# Patient Record
Sex: Male | Born: 2000 | Race: Black or African American | Hispanic: No | Marital: Single | Smoking: Never smoker
Health system: Southern US, Community
[De-identification: ages and names within clinical notes are randomized; demographics above are authoritative.]

---
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
## 2018-09-06 ENCOUNTER — Emergency Department (HOSPITAL_COMMUNITY)
Admission: EM | Admit: 2018-09-06 | Discharge: 2018-09-06 | Disposition: A | Discharge status: Home / Self Care | Payer: Managed Care, Other (non HMO) | Attending: Emergency Medicine | Admitting: Emergency Medicine

## 2018-09-06 ENCOUNTER — Encounter (HOSPITAL_COMMUNITY): Payer: Self-pay | Admitting: *Deleted

## 2018-09-06 DIAGNOSIS — F1910 Other psychoactive substance abuse, uncomplicated: Secondary | ICD-10-CM | POA: Diagnosis not present

## 2018-09-06 DIAGNOSIS — R4182 Altered mental status, unspecified: Secondary | ICD-10-CM | POA: Insufficient documentation

## 2018-09-06 DIAGNOSIS — F29 Unspecified psychosis not due to a substance or known physiological condition: Secondary | ICD-10-CM | POA: Diagnosis present

## 2018-09-06 DIAGNOSIS — F23 Brief psychotic disorder: Secondary | ICD-10-CM | POA: Insufficient documentation

## 2018-09-06 LAB — RAPID URINE DRUG SCREEN, HOSP PERFORMED
Amphetamines: NOT DETECTED
Barbiturates: NOT DETECTED
Benzodiazepines: NOT DETECTED
Cocaine: NOT DETECTED
Opiates: NOT DETECTED
Tetrahydrocannabinol: POSITIVE — AB

## 2018-09-06 LAB — COMPREHENSIVE METABOLIC PANEL
ALT: 17 U/L (ref 0–44)
AST: 26 U/L (ref 15–41)
Albumin: 4.7 g/dL (ref 3.5–5.0)
Alkaline Phosphatase: 45 U/L (ref 38–126)
Anion gap: 8 (ref 5–15)
BUN: 9 mg/dL (ref 6–20)
CO2: 27 mmol/L (ref 22–32)
CREATININE: 0.93 mg/dL (ref 0.61–1.24)
Calcium: 9.9 mg/dL (ref 8.9–10.3)
Chloride: 105 mmol/L (ref 98–111)
GFR calc non Af Amer: >60 mL/min (ref >60)
Glucose, Bld: 80 mg/dL (ref 70–99)
Potassium: 3.7 mmol/L (ref 3.5–5.1)
Sodium: 140 mmol/L (ref 135–145)
Total Bilirubin: 1.2 mg/dL (ref 0.3–1.2)
Total Protein: 8.1 g/dL (ref 6.5–8.1)

## 2018-09-06 LAB — CBC
HCT: 43.1 % (ref 39.0–52.0)
Hemoglobin: 14 g/dL (ref 13.0–17.0)
MCH: 29 pg (ref 26.0–34.0)
MCHC: 32.5 g/dL (ref 30.0–36.0)
MCV: 89.4 fL (ref 80.0–100.0)
Platelets: 208 10*3/uL (ref 150–400)
RBC: 4.82 MIL/uL (ref 4.22–5.81)
RDW: 11.6 % (ref 11.5–15.5)
WBC: 6.2 10*3/uL (ref 4.0–10.5)
nRBC: 0 % (ref 0.0–0.2)

## 2018-09-06 LAB — ETHANOL: Alcohol, Ethyl (B): <10 mg/dL (ref <10)

## 2018-09-06 NOTE — Discharge Instructions (Signed)
Return to ED for worsening symptoms, chest pain, shortness of breath, injuries or falls.

## 2018-09-06 NOTE — ED Notes (Signed)
Pt aware of need for urine sample, urinal at bedside 

## 2018-09-06 NOTE — ED Provider Notes (Signed)
[facility] Provider Note   CSN: [id number] Arrival date & time: 09/06/18  1124     History   Chief Complaint Chief Complaint  Patient presents with  . Drug Problem    HPI Ernest Ellison is a 18 y.o. male who presents to ED for altered mental status.  Majority of history is provided by father at the bedside.  Patient took a trip to [place] from 08/19/2018 to 09/05/2018.  Father is unsure if patient took drugs there.  He was hospitalized twice while being in [place], once in the psych unit.  Father states that since patient has returned from and also while he was in [place], he "is talking out of his head."  He states that patient will have been normal conversations" revert back to being like a child."  Father any history of these symptoms in the past.  Patient states that he is here because his father brought him here and because he had a CT scan of his head done in [place].  Patient unable to provide further history as he is unsure why he is here.  States that he has suicidal thoughts but "that does not mean him can act on it."  Denies any HI, AVH.  He is unwilling to tell me which drugs he used in [place].  HPI  History reviewed. No pertinent past medical history.  There are no active problems to display for this patient.     Home Medications    Prior to Admission medications   Not on File    Family History History reviewed. No pertinent family history.  Social History Social History   Tobacco Use  . Smoking status: Not on file  Substance Use Topics  . Alcohol use: Not on file  . Drug use: Not on file     Allergies   Patient has no known allergies.   Review of Systems Review of Systems  Constitutional: Negative for appetite change, chills and fever.  HENT: Negative for ear pain, rhinorrhea, sneezing and sore throat.   Eyes: Negative for photophobia and visual disturbance.  Respiratory: Negative for  cough, chest tightness, shortness of breath and wheezing.   Cardiovascular: Negative for chest pain and palpitations.  Gastrointestinal: Negative for abdominal pain, blood in stool, constipation, diarrhea, nausea and vomiting.  Genitourinary: Negative for dysuria, hematuria and urgency.  Musculoskeletal: Negative for myalgias.  Skin: Negative for rash.  Neurological: Negative for dizziness, weakness and light-headedness.  Psychiatric/Behavioral: Positive for behavioral problems. The patient is nervous/anxious.      Physical Exam Updated Vital Signs BP 121/76 (BP Location: Right Arm)   Pulse 75   Temp 98 F (36.7 C) (Oral)   Resp 18   SpO2 100%   Physical Exam Vitals signs and nursing note reviewed.  Constitutional:      General: He is not in acute distress.    Appearance: He is well-developed.  HENT:     Head: Normocephalic and atraumatic.     Nose: Nose normal.  Eyes:     General: No scleral icterus.       Right eye: No discharge.        Left eye: No discharge.     Conjunctiva/sclera: Conjunctivae normal.     Pupils: Pupils are equal, round, and reactive to light.  Neck:     Musculoskeletal: Normal range of motion and neck supple.  Cardiovascular:     Rate and Rhythm: Normal rate and regular rhythm.  Heart sounds: Normal heart sounds. No murmur. No friction rub. No gallop.   Pulmonary:     Effort: Pulmonary effort is normal. No respiratory distress.     Breath sounds: Normal breath sounds.  Abdominal:     General: Bowel sounds are normal. There is no distension.     Palpations: Abdomen is soft.     Tenderness: There is no abdominal tenderness. There is no guarding.  Musculoskeletal: Normal range of motion.  Skin:    General: Skin is warm and dry.     Findings: No rash.  Neurological:     General: No focal deficit present.     Mental Status: He is alert and oriented to person, place, and time.     Cranial Nerves: No cranial nerve deficit.     Sensory: No  sensory deficit.     Motor: No weakness or abnormal muscle tone.     Coordination: Coordination normal.     Comments: Alert and oriented x4.  Psychiatric:        Speech: Speech is tangential.        Behavior: Behavior is agitated.      ED Treatments / Results  Labs (all labs ordered are listed, but only abnormal results are displayed) Labs Reviewed  RAPID URINE DRUG SCREEN, HOSP PERFORMED - Abnormal; Notable for the following components:      Result Value   Tetrahydrocannabinol POSITIVE (*)    All other components within normal limits  COMPREHENSIVE METABOLIC PANEL  ETHANOL  CBC    EKG None  Radiology No results found.  Procedures Procedures (including critical care time)  Medications Ordered in ED Medications - No data to display   Initial Impression / Assessment and Plan / ED Course  I have reviewed the triage vital signs and the nursing notes.  Pertinent labs & imaging results that were available during my care of the patient were reviewed by me and considered in my medical decision making (see chart for details).     18 year old male presents to ED for help with drug use.  Father at bedside provides much of history.  States that patient went on a trip to [place] from 12/26 to 1/12 with unknown drug use there.  Since then father is concerned that he may have some type of psychosis associated with drug use.  Patient has never been on any antipsychotic, antidepressant or antianxiety medications.  Patient only admits to marijuana use.  He is unsure why he is here, he is speaking in an odd way. Reports transient SI but none currently with no plan.  Denies any HI, AVH.  His medical screening lab work is unremarkable, UDS positive only for THC.  I will consult TTS for final disposition as he is medically clear for evaluation.  3:05 PM TTS recommends discharge.  Will discharge with outpatient resources.  Patient is hemodynamically stable, in NAD, and able to ambulate  in the ED. Evaluation does not show pathology that would require ongoing emergent intervention or inpatient treatment. I explained the diagnosis to the patient. Pain has been managed and has no complaints prior to discharge. Patient is comfortable with above plan and is stable for discharge at this time. All questions were answered prior to disposition. Strict return precautions for returning to the ED were discussed. Encouraged follow up with PCP.    Portions of this note were generated with Scientist, clinical (histocompatibility and immunogenetics)Dragon dictation software. Dictation errors may occur despite best attempts at proofreading.  Final Clinical Impressions(s) / ED  Diagnoses   Final diagnoses:  Substance abuse [facility])    ED Discharge Orders    None       Dietrich Pates, PA-C 09/06/18 1505    Doug Sou, MD 09/06/18 1825

## 2018-09-06 NOTE — ED Triage Notes (Signed)
Pt here for medical clearance to help with drug use, no distress noted

## 2018-09-06 NOTE — BH Assessment (Addendum)
Tele Assessment Note   Patient Name: Ernest Ellison MRN: [id number] Referring Physician: PA Hina [facility] Location of Provider: Behavioral Health TTS Department  Ernest SchoonerDarius C Ellison is an 18 y.o. male.  The pt came in due to SA and past bizarre behavior.  The pt was in [place] from 08/19/2018-09/05/2018.  While in [place], the pt went to a hospital due to erratic behavior.  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  The pt won't state how much marijuana he uses.  He stated, "I get lit."  He stated he can do an 8th of an ounce of marijuana.  The pt denies SI, HI and current psychosis.  The pt appeared restless, as evidenced by asking several times to take off the blood pressure cuff, remove his wrist band and wanting to pace.  The pt was pleasant and cooperative.  The pt denies any mental health treatment other than family counseling when his parents went through a divorce.  The pt lives primarily with his father.  He will sometime live with his mother in [place] or with friends.  The pt works for [employer].  The pt's father denies any mental health issues in the family.  The pt denies self harm, legal issues, history of abuse and hallucinations. The pt stated he sleeps well and has a fair appetite. The pt denies other SA besides marijuana.  The pt's UDS is negative for other substances.    Pt is dressed in a hospital gown. He is alert and oriented x4. Pt speaks in a clear tone, at moderate volume and normal pace. Eye contact is good. Pt's mood is restless and silly. Thought process is coherent and relevant. There is no indication Pt is currently responding to internal stimuli or experiencing delusional thought content.?Pt was cooperative throughout assessment.     Diagnosis: F23 Brief psychotic disorder  Past Medical History: History reviewed. No pertinent past medical history.   Family  History: History reviewed. No pertinent family history.  Social History:  has no history on file for tobacco, alcohol, and drug.  Additional Social History:  Alcohol / Drug Use Pain Medications: See MAR Prescriptions: See MAR Over the Counter: See MAR History of alcohol / drug use?: Yes Longest period of sobriety (when/how long): NA Substance #1 Name of Substance 1: marijuana 1 - Age of First Use: 13 1 - Amount (size/oz): pt would not specify.  "I smoke to get lit".  When asked if he can do an 8th he said yes 1 - Frequency: daily 1 - Duration: on going 1 - Last Use / Amount: 09/05/2018  CIWA: CIWA-Ar BP: 124/73 Pulse Rate: 83 COWS: Clinical Opiate Withdrawal Scale (COWS) Resting Pulse Rate: Pulse Rate 80 or below Sweating: No report of chills or flushing Restlessness: Frequent shifting or extraneous movements of legs/arms Pupil Size: Pupils pinned or normal size for room light Bone or Joint Aches: Not present Runny Nose or Tearing: Not present GI Upset: No GI symptoms Tremor: No tremor Yawning: No yawning Anxiety or Irritability: Patient obviously irritable/anxious Gooseflesh Skin: Skin is smooth COWS Total Score: 5  Allergies: No Known Allergies  Home Medications: (Not in a hospital admission)   OB/GYN Status:  No LMP for male patient.  General Assessment Data [facility] TTS Assessment: In system Is this a Tele or Face-to-Face Assessment?: Face-to-Face Is this an Initial Assessment or a Re-assessment for  this encounter?: Initial Assessment Patient Accompanied by:: Parent Language Other than English: No Living Arrangements: Other (Comment)(home) What gender do you identify as?: Male Marital status: Single Maiden name: NA Pregnancy Status: No Living Arrangements: Parent Can pt return to current living arrangement?: Yes Admission Status: Voluntary Is patient capable of signing voluntary admission?: Yes Referral Source:  Self/Family/Friend Insurance type: Medical sales representative     Crisis Care Plan Living Arrangements: Parent Legal Guardian: Other:(Self) Name of Psychiatrist: none Name of Therapist: none  Education Status Is patient currently in school?: No Is the patient employed, unemployed or receiving disability?: Employed  Risk to self with the past 6 months Suicidal Ideation: No Has patient been a risk to self within the past 6 months prior to admission? : No Suicidal Intent: No Has patient had any suicidal intent within the past 6 months prior to admission? : No Is patient at risk for suicide?: No Suicidal Plan?: No Has patient had any suicidal plan within the past 6 months prior to admission? : No Access to Means: No What has been your use of drugs/alcohol within the last 12 months?: marijuana use Previous Attempts/Gestures: No How many times?: 0 Other Self Harm Risks: none Triggers for Past Attempts: None known Intentional Self Injurious Behavior: None Family Suicide History: No Recent stressful life event(s): Other (Comment)(no major stressors) Persecutory voices/beliefs?: No Depression: No Substance abuse history and/or treatment for substance abuse?: Yes Suicide prevention information given to non-admitted patients: Yes  Risk to Others within the past 6 months Homicidal Ideation: No Does patient have any lifetime risk of violence toward others beyond the six months prior to admission? : No Thoughts of Harm to Others: No Current Homicidal Intent: No Current Homicidal Plan: No Access to Homicidal Means: No Identified Victim: none History of harm to others?: No Assessment of Violence: None Noted Violent Behavior Description: none Does patient have access to weapons?: No Criminal Charges Pending?: No Does patient have a court date: No Is patient on probation?: No  Psychosis Hallucinations: None noted Delusions: None noted  Mental Status Report Appearance/Hygiene: Unremarkable Eye  Contact: Good Motor Activity: Freedom of movement, Unremarkable Speech: Logical/coherent Level of Consciousness: Alert, Restless Mood: Silly, Anxious Affect: Silly, Irritable Anxiety Level: None Thought Processes: Coherent, Relevant Judgement: Partial Orientation: Person, Place, Time, Situation Obsessive Compulsive Thoughts/Behaviors: None  Cognitive Functioning Concentration: Normal Memory: Recent Intact, Remote Intact Is patient IDD: No Insight: Fair Impulse Control: Fair Appetite: Fair Have you had any weight changes? : No Change Sleep: No Change Total Hours of Sleep: 8 Vegetative Symptoms: None  ADLScreening [facility] Assessment Services) Patient's cognitive ability adequate to safely complete daily activities?: Yes Patient able to express need for assistance with ADLs?: Yes Independently performs ADLs?: Yes (appropriate for developmental age)  Prior Inpatient Therapy Prior Inpatient Therapy: No  Prior Outpatient Therapy Prior Outpatient Therapy: No Does patient have an ACCT team?: No Does patient have Intensive In-House Services?  : No Does patient have [facility] services? : No Does patient have P4CC services?: No  ADL Screening (condition at time of admission) Patient's cognitive ability adequate to safely complete daily activities?: Yes Patient able to express need for assistance with ADLs?: Yes Independently performs ADLs?: Yes (appropriate for developmental age)       Abuse/Neglect Assessment (Assessment to be complete while patient is alone) Abuse/Neglect Assessment Can Be Completed: Yes Physical Abuse: Denies Verbal Abuse: Denies Sexual Abuse: Denies Exploitation of patient/patient's resources: Denies Self-Neglect: Denies Values / Beliefs Cultural Requests During Hospitalization: None Spiritual Requests During  Hospitalization: None Consults Spiritual Care Consult Needed: No Social Work Consult Needed: No            Disposition:   Disposition Initial Assessment Completed for this Encounter: Yes  NP Nanine MeansJamison Lord recommends the pt be discharged and follow up with OPT appointment.  PA Hina was made aware of the recommendation.  This service was provided via telemedicine using a 2-way, interactive audio and video technology.  Names of all persons participating in this telemedicine service and their role in this encounter. Name: Aida PufferDarius Carns Role: Pt  Name: Dyann Kiefarrell Caccamo Role: Pt's father  Name: Riley ChurchesKendall Deniqua Perry Role: TTS  Name:  Role:     Ottis StainGarvin, Jamal Haskin Jermaine 09/06/2018 3:57 PM

## 2020-11-07 ENCOUNTER — Encounter: Payer: Self-pay | Admitting: Emergency Medicine

## 2020-11-07 ENCOUNTER — Other Ambulatory Visit: Payer: Self-pay

## 2020-11-07 ENCOUNTER — Ambulatory Visit
Admission: EM | Admit: 2020-11-07 | Discharge: 2020-11-07 | Disposition: A | Discharge status: Home / Self Care | Payer: Managed Care, Other (non HMO) | Attending: Family Medicine | Admitting: Family Medicine

## 2020-11-07 ENCOUNTER — Telehealth: Payer: Self-pay | Admitting: Family Medicine

## 2020-11-07 DIAGNOSIS — L0201 Cutaneous abscess of face: Secondary | ICD-10-CM

## 2020-11-07 MED ORDER — DOXYCYCLINE HYCLATE 100 MG PO CAPS: 100.0000 mg | ORAL_CAPSULE | Freq: Two times a day (BID) | ORAL | 0 refills | Status: DC

## 2020-11-07 MED ORDER — HYDROCODONE-ACETAMINOPHEN 5-325 MG PO TABS: 1.0000 | ORAL_TABLET | Freq: Four times a day (QID) | ORAL | 0 refills | Status: DC | PRN

## 2020-11-07 NOTE — Discharge Instructions (Signed)

## 2020-11-07 NOTE — ED Triage Notes (Signed)
abscess on upper lip has been there a few days.

## 2020-11-07 NOTE — ED Provider Notes (Signed)
[facility] CARE CENTER   [id number] 11/07/20 Arrival Time: 1206  ASSESSMENT & PLAN:  1. Facial abscess    Feel he should seen surgical evaluation and I&D. Discussed need for prompt evaluation.  Begin: Meds ordered this encounter  Medications  . doxycycline (VIBRAMYCIN) 100 MG capsule    Sig: Take 1 capsule (100 mg total) by mouth 2 (two) times daily.    Dispense:  20 capsule    Refill:  0  . HYDROcodone-acetaminophen (NORCO/VICODIN) 5-325 MG tablet    Sig: Take 1 tablet by mouth every 6 (six) hours as needed for moderate pain or severe pain.    Dispense:  10 tablet    Refill:  0   Will call to schedule work-in as below; otherwise will proceed to ED for evaluation.   Follow-up Information    Schedule an appointment as soon as possible for a visit  with Newman Pies, MD.   Specialty: Otolaryngology Contact information: [address] [place] [address] [phone]        Schedule an appointment as soon as possible for a visit  with [facility].   Contact information: [address] [place] [address] [phone]        Schedule an appointment as soon as possible for a visit  with [facility].   Specialty: Otolaryngology Contact information: [address] [place] [address] [phone] Additional information: Satellite Office - live on EPIC       [facility] EMERGENCY DEPARTMENT.   Specialty: Emergency Medicine Why: If you are unable to see an ENT doctor as we discussed. Contact information: [address] [id number] mc [place] [address] [phone]               Reviewed expectations re: course of current medical issues. Questions answered. Outlined signs and symptoms indicating need for more acute intervention. Patient verbalized understanding. After Visit Summary given.   SUBJECTIVE:  Ernest Ellison is a 20 y.o.  male who presents with a possible infection of his mid upper lip. Onset gradual, approximately 5 days ago with slight drainage and bleeding. Symptoms have gradually worsened since beginning. Increasing pain. Fever: absent. OTC/home treatment: "squeezing it" without much relief.   OBJECTIVE:  Vitals:   11/07/20 1215  BP: 135/82  Pulse: (!) 101  Resp: 18  Temp: 98.5 F (36.9 C)  TempSrc: Oral  SpO2: 93%    Slight tachycardia noted. General appearance: alert; no distress HEENT: large festering abscess/wound of upper lip; no gum inflammation; no active drainage or bleeding; very tender to touch; teeth intact; no cervical LAD; FROM of neck Psychological: alert and cooperative; normal mood and affect  No Known Allergies  History reviewed. No pertinent past medical history. Social History   Socioeconomic History  . Marital status: Single    Spouse name: Not on file  . Number of children: Not on file  . Years of education: Not on file  . Highest education level: Not on file  Occupational History  . Not on file  Tobacco Use  . Smoking status: Never Smoker  . Smokeless tobacco: Never Used  Vaping Use  . Vaping Use: Every day  Substance and Sexual Activity  . Alcohol use: Yes    Alcohol/week: 4.0 standard drinks    Types: 4 Cans of beer per week  . Drug use: Never  . Sexual activity: Not on file  Other Topics Concern  .  Not on file  Social History Narrative  . Not on file   Social Determinants of Health   Financial Resource Strain: Not on file  Food Insecurity: Not on file  Transportation Needs: Not on file  Physical Activity: Not on file  Stress: Not on file  Social Connections: Not on file   History reviewed. No pertinent family history. History reviewed. No pertinent surgical history.         Mardella Layman, MD 11/07/20 1304

## 2020-11-07 NOTE — Telephone Encounter (Signed)
Pharmacy change

## 2020-11-08 ENCOUNTER — Telehealth: Payer: Self-pay | Admitting: Family Medicine

## 2020-11-08 MED ORDER — HYDROCODONE-ACETAMINOPHEN 7.5-325 MG PO TABS: ORAL_TABLET | ORAL | 0 refills | Status: DC

## 2020-11-08 NOTE — Telephone Encounter (Signed)
[facility] called. Out of 5mg  Norco. Rx cancelled. New Rx sent as below. Have the 7.5mg .  Meds ordered this encounter  Medications  . HYDROcodone-acetaminophen (NORCO) 7.5-325 MG tablet    Sig: Take 1/2 to 1 tablet every 6 hours as needed for pain.    Dispense:  10 tablet    Refill:  0

## 2022-05-27 ENCOUNTER — Encounter: Payer: Self-pay | Admitting: Internal Medicine

## 2022-05-27 ENCOUNTER — Ambulatory Visit: Payer: BC Managed Care – PPO | Admitting: Internal Medicine

## 2022-05-27 VITALS — BP 124/79 | HR 77 | Ht 70.0 in | Wt 204.2 lb

## 2022-05-27 DIAGNOSIS — Z131 Encounter for screening for diabetes mellitus: Secondary | ICD-10-CM | POA: Diagnosis not present

## 2022-05-27 DIAGNOSIS — Z114 Encounter for screening for human immunodeficiency virus [HIV]: Secondary | ICD-10-CM | POA: Diagnosis not present

## 2022-05-27 DIAGNOSIS — J029 Acute pharyngitis, unspecified: Secondary | ICD-10-CM

## 2022-05-27 DIAGNOSIS — Z1159 Encounter for screening for other viral diseases: Secondary | ICD-10-CM

## 2022-05-27 DIAGNOSIS — Z23 Encounter for immunization: Secondary | ICD-10-CM

## 2022-05-27 DIAGNOSIS — Z13220 Encounter for screening for lipoid disorders: Secondary | ICD-10-CM

## 2022-05-27 DIAGNOSIS — Z7689 Persons encountering health services in other specified circumstances: Secondary | ICD-10-CM

## 2022-05-27 NOTE — Progress Notes (Unsigned)
New Patient Office Visit  Subjective    Patient ID: Ernest Ellison, male    DOB: 01-23-2001  Age: 21 y.o. MRN: [id number]  CC:  Chief Complaint  Patient presents with   Establish Care   HPI Ernest Ellison presents to establish care.  He is a 21 year old male with no significant past medical history.  He reports a history of STI 2 years ago but states that he was screened recently and was negative.  He denies any significant family medical history.  Ernest Ellison denies current tobacco use.  He drinks alcohol socially and smokes marijuana daily.  His acute concern today is a sore throat that has been present for the past week.  He has been using Chloraseptic spray for symptom relief.  Ernest Ellison endorses chills but has not checked his temperature.  He denies additional associated symptoms, including cough, rhinorrhea, shortness of breath, or sputum production.  Acute concerns and outstanding preventative healthcare maintenance items discussed today are individually addressed in A/P below.  Outpatient Encounter Medications as of 05/27/2022  Medication Sig   [DISCONTINUED] doxycycline (VIBRAMYCIN) 100 MG capsule Take 1 capsule (100 mg total) by mouth 2 (two) times daily.   [DISCONTINUED] HYDROcodone-acetaminophen (NORCO) 7.5-325 MG tablet Take 1/2 to 1 tablet every 6 hours as needed for pain.   No facility-administered encounter medications on file as of 05/27/2022.    No past medical history on file.  No past surgical history on file.  No family history on file.  Social History   Socioeconomic History   Marital status: Single    Spouse name: Not on file   Number of children: Not on file   Years of education: Not on file   Highest education level: Not on file  Occupational History   Not on file  Tobacco Use   Smoking status: Never   Smokeless tobacco: Never  Vaping Use   Vaping Use: Every day  Substance and Sexual Activity   Alcohol use: Yes    Alcohol/week: 4.0  standard drinks of alcohol    Types: 4 Cans of beer per week   Drug use: Never   Sexual activity: Not on file  Other Topics Concern   Not on file  Social History Narrative   Not on file   Social Determinants of Health   Financial Resource Strain: Not on file  Food Insecurity: Not on file  Transportation Needs: Not on file  Physical Activity: Not on file  Stress: Not on file  Social Connections: Not on file  Intimate Partner Violence: Not on file    Review of Systems  HENT:  Positive for sore throat.   All other systems reviewed and are negative.       Objective    BP 124/79   Pulse 77   Ht 5\' 10"  (1.778 m)   Wt 204 lb 3.2 oz (92.6 kg)   SpO2 94%   BMI 29.30 kg/m   Physical Exam Vitals reviewed.  Constitutional:      General: He is not in acute distress.    Appearance: Normal appearance. He is not ill-appearing.  HENT:     Head: Normocephalic and atraumatic.     Nose: Nose normal. No congestion or rhinorrhea.     Mouth/Throat:     Mouth: Mucous membranes are moist.     Pharynx: Oropharyngeal exudate and posterior oropharyngeal erythema present.  Eyes:     Extraocular Movements: Extraocular movements intact.     Conjunctiva/sclera: Conjunctivae  normal.     Pupils: Pupils are equal, round, and reactive to light.  Cardiovascular:     Rate and Rhythm: Normal rate and regular rhythm.     Pulses: Normal pulses.     Heart sounds: Normal heart sounds. No murmur heard. Pulmonary:     Effort: Pulmonary effort is normal.     Breath sounds: Normal breath sounds. No wheezing, rhonchi or rales.  Abdominal:     General: Abdomen is flat. Bowel sounds are normal. There is no distension.     Palpations: Abdomen is soft.     Tenderness: There is no abdominal tenderness.  Musculoskeletal:        General: No swelling or deformity. Normal range of motion.     Cervical back: Normal range of motion.  Skin:    General: Skin is warm and dry.     Capillary Refill: Capillary  refill takes less than 2 seconds.  Neurological:     General: No focal deficit present.     Mental Status: He is alert and oriented to person, place, and time.     Motor: No weakness.  Psychiatric:        Mood and Affect: Mood normal.        Behavior: Behavior normal.        Thought Content: Thought content normal.    Assessment & Plan:   Problem List Items Addressed This Visit       Encounter to establish care    Presenting today to establish care.  Recent medical records reviewed. -Baseline labs ordered today, including one-time HCV/HIV screening -Tdap and influenza vaccines administered today -COVID-19 vaccination declined      Sore throat    He reports a 1 week history of a sore throat.  There is erythema and exudate present on exam today.  I have ordered testing for strep pharyngitis and mononucleosis today.  Further treatment pending results.      Return in about 6 months (around 11/26/2022).   Billie Lade, MD

## 2022-05-27 NOTE — Patient Instructions (Signed)
It was a pleasure to see you today.  Thank you for giving [place] the opportunity to be involved in your care.  Below is a brief recap of your visit and next steps.  We will plan to see you again in 6 months.  Summary We will check basic labs today and you will receive your flu and tetanus vaccines. We will also check for strep throat and mono today  Next steps Follow up in 6 months I will notify you of lab results

## 2022-05-28 LAB — CBC WITH DIFFERENTIAL/PLATELET
Basophils Absolute: 0 10*3/uL (ref 0.0–0.2)
Basos: 0 %
EOS (ABSOLUTE): 0 10*3/uL (ref 0.0–0.4)
Eos: 1 %
Hematocrit: 44.3 % (ref 37.5–51.0)
Hemoglobin: 14.9 g/dL (ref 13.0–17.7)
Immature Grans (Abs): 0 10*3/uL (ref 0.0–0.1)
Immature Granulocytes: 0 %
Lymphocytes Absolute: 1.7 10*3/uL (ref 0.7–3.1)
Lymphs: 22 %
MCH: 30 pg (ref 26.6–33.0)
MCHC: 33.6 g/dL (ref 31.5–35.7)
MCV: 89 fL (ref 79–97)
Monocytes Absolute: 0.7 10*3/uL (ref 0.1–0.9)
Monocytes: 10 %
Neutrophils Absolute: 4.9 10*3/uL (ref 1.4–7.0)
Neutrophils: 67 %
Platelets: 188 10*3/uL (ref 150–450)
RBC: 4.96 x10E6/uL (ref 4.14–5.80)
RDW: 11.3 % — ABNORMAL LOW (ref 11.6–15.4)
WBC: 7.4 10*3/uL (ref 3.4–10.8)

## 2022-05-28 LAB — CMP14+EGFR
ALT: 16 IU/L (ref 0–44)
AST: 29 IU/L (ref 0–40)
Albumin/Globulin Ratio: 1.6 (ref 1.2–2.2)
Albumin: 4.7 g/dL (ref 4.3–5.2)
Alkaline Phosphatase: 89 IU/L (ref 44–121)
BUN/Creatinine Ratio: 9 (ref 9–20)
BUN: 9 mg/dL (ref 6–20)
Bilirubin Total: 0.6 mg/dL (ref 0.0–1.2)
CO2: 23 mmol/L (ref 20–29)
Calcium: 9.8 mg/dL (ref 8.7–10.2)
Chloride: 103 mmol/L (ref 96–106)
Creatinine, Ser: 1 mg/dL (ref 0.76–1.27)
Globulin, Total: 3 g/dL (ref 1.5–4.5)
Glucose: 102 mg/dL — ABNORMAL HIGH (ref 70–99)
Potassium: 4.2 mmol/L (ref 3.5–5.2)
Sodium: 141 mmol/L (ref 134–144)
Total Protein: 7.7 g/dL (ref 6.0–8.5)
eGFR: 108 mL/min/{1.73_m2} (ref >59)

## 2022-05-28 LAB — HEMOGLOBIN A1C
Est. average glucose Bld gHb Est-mCnc: 100 mg/dL
Hgb A1c MFr Bld: 5.1 % (ref 4.8–5.6)

## 2022-05-28 LAB — HCV AB W REFLEX TO QUANT PCR: HCV Ab: NONREACTIVE

## 2022-05-28 LAB — LIPID PANEL
Chol/HDL Ratio: 3.8 ratio (ref 0.0–5.0)
Cholesterol, Total: 142 mg/dL (ref 100–199)
HDL: 37 mg/dL — ABNORMAL LOW (ref >39)
LDL Chol Calc (NIH): 78 mg/dL (ref 0–99)
Triglycerides: 152 mg/dL — ABNORMAL HIGH (ref 0–149)
VLDL Cholesterol Cal: 27 mg/dL (ref 5–40)

## 2022-05-28 LAB — HCV INTERPRETATION

## 2022-05-28 LAB — HIV ANTIBODY (ROUTINE TESTING W REFLEX): HIV Screen 4th Generation wRfx: NONREACTIVE

## 2022-05-29 ENCOUNTER — Encounter: Payer: Self-pay | Admitting: Internal Medicine

## 2022-05-29 DIAGNOSIS — J029 Acute pharyngitis, unspecified: Secondary | ICD-10-CM | POA: Insufficient documentation

## 2022-05-29 DIAGNOSIS — Z7689 Persons encountering health services in other specified circumstances: Secondary | ICD-10-CM | POA: Insufficient documentation

## 2022-05-29 NOTE — Assessment & Plan Note (Signed)
Presenting today to establish care.  Recent medical records reviewed. -Baseline labs ordered today, including one-time HCV/HIV screening -Tdap and influenza vaccines administered today -COVID-19 vaccination declined

## 2022-05-29 NOTE — Assessment & Plan Note (Signed)
He reports a 1 week history of a sore throat.  There is erythema and exudate present on exam today.  I have ordered testing for strep pharyngitis and mononucleosis today.  Further treatment pending results.

## 2022-05-30 ENCOUNTER — Other Ambulatory Visit: Payer: Self-pay | Admitting: Internal Medicine

## 2022-05-30 MED ORDER — AMOXICILLIN 500 MG PO CAPS: 500.0000 mg | ORAL_CAPSULE | Freq: Two times a day (BID) | ORAL | 0 refills | Status: AC

## 2022-05-31 LAB — CULTURE, GROUP A STREP

## 2022-05-31 LAB — MONONUCLEOSIS SCREEN: Mono Screen: NEGATIVE

## 2022-05-31 LAB — RAPID STREP SCREEN (MED CTR MEBANE ONLY)

## 2022-05-31 LAB — SPECIMEN STATUS REPORT

## 2022-11-26 ENCOUNTER — Ambulatory Visit: Payer: BC Managed Care – PPO | Admitting: Internal Medicine

## 2022-12-19 ENCOUNTER — Ambulatory Visit: Payer: BC Managed Care – PPO | Admitting: Internal Medicine
